# Patient Record
Sex: Male | Born: 1960 | Race: White | Hispanic: No | Marital: Married | Smoking: Never smoker
Health system: Southern US, Community
[De-identification: ages and names within clinical notes are randomized; demographics above are authoritative.]

## PROBLEM LIST (undated history)

## (undated) DIAGNOSIS — I219 Acute myocardial infarction, unspecified: Secondary | ICD-10-CM

## (undated) HISTORY — PX: CARDIAC SURGERY: SHX584

## (undated) HISTORY — PX: CORONARY ANGIOPLASTY WITH STENT PLACEMENT: SHX49

---

## 2018-08-20 ENCOUNTER — Other Ambulatory Visit: Payer: Self-pay

## 2018-08-20 ENCOUNTER — Emergency Department (HOSPITAL_BASED_OUTPATIENT_CLINIC_OR_DEPARTMENT_OTHER): Payer: No Typology Code available for payment source

## 2018-08-20 ENCOUNTER — Encounter (HOSPITAL_BASED_OUTPATIENT_CLINIC_OR_DEPARTMENT_OTHER): Payer: Self-pay | Admitting: Emergency Medicine

## 2018-08-20 ENCOUNTER — Emergency Department (HOSPITAL_BASED_OUTPATIENT_CLINIC_OR_DEPARTMENT_OTHER)
Admission: EM | Admit: 2018-08-20 | Discharge: 2018-08-20 | Disposition: A | Discharge status: Home / Self Care | Payer: No Typology Code available for payment source | Attending: Emergency Medicine | Admitting: Emergency Medicine

## 2018-08-20 DIAGNOSIS — M25522 Pain in left elbow: Secondary | ICD-10-CM | POA: Diagnosis present

## 2018-08-20 DIAGNOSIS — M7022 Olecranon bursitis, left elbow: Secondary | ICD-10-CM | POA: Diagnosis not present

## 2018-08-20 DIAGNOSIS — Y939 Activity, unspecified: Secondary | ICD-10-CM | POA: Diagnosis not present

## 2018-08-20 HISTORY — DX: Acute myocardial infarction, unspecified: I21.9

## 2018-08-20 MED ORDER — IBUPROFEN 800 MG PO TABS
800.0000 mg | ORAL_TABLET | Freq: Once | ORAL | Status: AC
  Administered 2018-08-20: 18:00:00 800 mg via ORAL
  Filled 2018-08-20: qty 1

## 2018-08-20 NOTE — Discharge Instructions (Addendum)
Recommend 600 mg to 800 mg of Motrin 3 times a day for the next 5 to 7 days.  Recommend ice.  Follow-up with orthopedics as discussed.

## 2018-08-20 NOTE — ED Provider Notes (Signed)
[facility] EMERGENCY DEPARTMENT Provider Note   CSN: [id number] Arrival date & time: 08/20/18  1728    History   Chief Complaint Chief Complaint  Patient presents with  . Joint Swelling    HPI Manuel HolmesCharles Harrington is a 58 y.o. male.     The history is provided by the patient.  Arm Injury Location:  Elbow Elbow location:  L elbow Upper extremity injury: possibly.   Pain details:    Quality: no pain.   Severity:  No pain   Onset quality:  Gradual   Duration:  3 days   Timing:  Constant   Progression:  Worsening Relieved by:  Nothing Worsened by:  Movement Associated symptoms: swelling   Associated symptoms: no back pain, no decreased range of motion, no fatigue, no fever, no muscle weakness and no neck pain     Past Medical History:  Diagnosis Date  . Heart attack (HCC)     There are no active problems to display for this patient.        Home Medications    Prior to Admission medications   Not on File    Family History No family history on file.  Social History Social History   Tobacco Use  . Smoking status: Not on file  Substance Use Topics  . Alcohol use: Never    Frequency: Never  . Drug use: Never     Allergies   Patient has no allergy information on record.   Review of Systems Review of Systems  Constitutional: Negative for chills, fatigue and fever.  HENT: Negative for ear pain and sore throat.   Eyes: Negative for pain and visual disturbance.  Respiratory: Negative for cough and shortness of breath.   Cardiovascular: Negative for chest pain and palpitations.  Gastrointestinal: Negative for abdominal pain and vomiting.  Genitourinary: Negative for dysuria and hematuria.  Musculoskeletal: Positive for joint swelling. Negative for arthralgias, back pain and neck pain.  Skin: Negative for color change and rash.  Neurological: Negative for seizures and syncope.  All other systems reviewed and are negative.    Physical  Exam Updated Vital Signs BP 121/87 (BP Location: Right Arm)   Pulse 100   Temp 97.9 F (36.6 C) (Oral)   Resp 18   Ht 5\' 5"  (1.651 m)   Wt 79.8 kg   SpO2 100%   BMI 29.29 kg/m   Physical Exam Vitals signs and nursing note reviewed.  Constitutional:      Appearance: He is well-developed.  HENT:     Head: Normocephalic and atraumatic.     Nose: Nose normal.     Mouth/Throat:     Mouth: Mucous membranes are moist.  Eyes:     Conjunctiva/sclera: Conjunctivae normal.  Neck:     Musculoskeletal: Normal range of motion and neck supple.  Cardiovascular:     Rate and Rhythm: Normal rate and regular rhythm.     Heart sounds: No murmur.  Pulmonary:     Effort: Pulmonary effort is normal. No respiratory distress.     Breath sounds: Normal breath sounds.  Abdominal:     Palpations: Abdomen is soft.     Tenderness: There is no abdominal tenderness.  Musculoskeletal: Normal range of motion.        General: Swelling present. No tenderness.     Comments: Swelling to left olecranon bursa  Skin:    General: Skin is warm and dry.     Findings: No erythema.  Comments: No redness or swelling of the left elbow  Neurological:     General: No focal deficit present.     Mental Status: He is alert.     Sensory: No sensory deficit.     Motor: No weakness.      ED Treatments / Results  Labs (all labs ordered are listed, but only abnormal results are displayed) Labs Reviewed - No data to display  EKG None  Radiology No results found.  Procedures Procedures (including critical care time)  Medications Ordered in ED Medications  ibuprofen (ADVIL) tablet 800 mg (has no administration in time range)     Initial Impression / Assessment and Plan / ED Course  I have reviewed the triage vital signs and the nursing notes.  Pertinent labs & imaging results that were available during my care of the patient were reviewed by me and considered in my medical decision making (see chart  for details).     Manuel Harrington is a 58 year old male with no significant medical history who presents to the ED with left elbow swelling.  Patient with normal vitals.  No fever.  Swelling ongoing for the last 3 days.  Unsure if any trauma.  Possibly some on Friday.  Patient on exam has olecranon bursitis.  There is no signs of infection.  Normal range of motion of the left arm.  Recommend conservative treatment with ice, Motrin.  He will follow-up with his orthopedic tomorrow.  Given return precautions and discharged from ED in good condition.  No concern for fracture.  This chart was dictated using voice recognition software.  Despite best efforts to proofread,  errors can occur which can change the documentation meaning.    Final Clinical Impressions(s) / ED Diagnoses   Final diagnoses:  Olecranon bursitis of left elbow    ED Discharge Orders    None       Lennice Sites, DO 08/20/18 1750

## 2018-08-20 NOTE — ED Triage Notes (Signed)
Pt c/o left elbow swelling. Pt states felt "pop" on Friday when getting out of bed. Pt denies injury/

## 2018-08-22 ENCOUNTER — Emergency Department (HOSPITAL_BASED_OUTPATIENT_CLINIC_OR_DEPARTMENT_OTHER): Payer: No Typology Code available for payment source

## 2018-08-22 ENCOUNTER — Emergency Department (HOSPITAL_BASED_OUTPATIENT_CLINIC_OR_DEPARTMENT_OTHER)
Admission: EM | Admit: 2018-08-22 | Discharge: 2018-08-23 | Disposition: A | Discharge status: Home / Self Care | Payer: No Typology Code available for payment source | Attending: Emergency Medicine | Admitting: Emergency Medicine

## 2018-08-22 ENCOUNTER — Other Ambulatory Visit: Payer: Self-pay

## 2018-08-22 ENCOUNTER — Encounter (HOSPITAL_BASED_OUTPATIENT_CLINIC_OR_DEPARTMENT_OTHER): Payer: Self-pay

## 2018-08-22 DIAGNOSIS — R58 Hemorrhage, not elsewhere classified: Secondary | ICD-10-CM

## 2018-08-22 DIAGNOSIS — R233 Spontaneous ecchymoses: Secondary | ICD-10-CM | POA: Insufficient documentation

## 2018-08-22 DIAGNOSIS — I252 Old myocardial infarction: Secondary | ICD-10-CM | POA: Insufficient documentation

## 2018-08-22 DIAGNOSIS — M25522 Pain in left elbow: Secondary | ICD-10-CM | POA: Insufficient documentation

## 2018-08-22 NOTE — ED Triage Notes (Signed)
Pt state he felt a pop in his left elbow 7/31-swelling to area 8/1 and was seen here-bruising x today-NAD-steady gait

## 2018-08-22 NOTE — ED Provider Notes (Signed)
[facility] EMERGENCY DEPARTMENT Provider Note   CSN: [id number] Arrival date & time: 08/22/18  2204     History   Chief Complaint Chief Complaint  Patient presents with  . Elbow Injury    HPI Manuel Harrington is a 58 y.o. male.     The history is provided by the patient.  Arm Injury Location:  Elbow Elbow location:  L elbow Upper extremity injury: woke up and heard a pop about a week ago,  bruise on the L olecranon and medial condyle.   Pain details:    Quality:  Aching   Radiates to:  Does not radiate   Severity:  Mild   Onset quality:  Sudden   Duration:  1 week   Timing:  Constant   Progression:  Unchanged Handedness:  Right-handed Dislocation: no   Foreign body present:  No foreign bodies Prior injury to area:  No Relieved by:  Nothing Worsened by:  Nothing Ineffective treatments:  None tried Associated symptoms: no back pain, no decreased range of motion, no fatigue, no fever, no muscle weakness, no neck pain, no numbness, no stiffness, no swelling and no tingling   Risk factors: no concern for non-accidental trauma   Seen for same post pop with bruise on olecranon.  Now today woke with bruise on medial epicondyle.  No new trauma.  No weakness no numbness.  No f/c/r.  No weight loss. Is on ASA  Past Medical History:  Diagnosis Date  . Heart attack (HCC)     There are no active problems to display for this patient.   Past Surgical History:  Procedure Laterality Date  . CARDIAC SURGERY    . CORONARY ANGIOPLASTY WITH STENT PLACEMENT          Home Medications    Prior to Admission medications   Medication Sig Start Date End Date Taking? Authorizing Provider  ibuprofen (ADVIL) 600 MG tablet Take 600 mg by mouth every 6 (six) hours as needed for moderate pain.   Yes [provider]    Family History No family history on file.  Social History Social History   Tobacco Use  . Smoking status: Never Smoker  . Smokeless tobacco:  Never Used  Substance Use Topics  . Alcohol use: Never    Frequency: Never  . Drug use: Never     Allergies   Patient has no known allergies.   Review of Systems Review of Systems  Constitutional: Negative for fatigue, fever and unexpected weight change.  HENT: Negative for sore throat.   Eyes: Negative for visual disturbance.  Respiratory: Negative for cough and shortness of breath.   Cardiovascular: Negative for chest pain.  Gastrointestinal: Negative for abdominal pain.  Musculoskeletal: Negative for back pain, neck pain and stiffness.  Neurological: Negative for weakness and numbness.  Hematological: Negative for adenopathy.  Psychiatric/Behavioral: Negative for agitation.  All other systems reviewed and are negative.    Physical Exam Updated Vital Signs BP (!) 149/93 (BP Location: Right Arm)   Pulse 98   Temp 98.4 F (36.9 C) (Oral)   Resp 20   Ht 5\' 5"  (1.651 m)   Wt 87.1 kg   SpO2 99%   BMI 31.95 kg/m   Physical Exam Vitals signs and nursing note reviewed.  Constitutional:      General: He is not in acute distress.    Appearance: He is normal weight.  HENT:     Head: Normocephalic and atraumatic.     Nose:  Nose normal.     Mouth/Throat:     Mouth: Mucous membranes are moist.     Pharynx: Oropharynx is clear.  Eyes:     Conjunctiva/sclera: Conjunctivae normal.     Pupils: Pupils are equal, round, and reactive to light.  Neck:     Musculoskeletal: Normal range of motion and neck supple.  Cardiovascular:     Rate and Rhythm: Normal rate and regular rhythm.     Pulses: Normal pulses.     Heart sounds: Normal heart sounds.  Pulmonary:     Effort: Pulmonary effort is normal.     Breath sounds: Normal breath sounds.  Abdominal:     General: Abdomen is flat. Bowel sounds are normal.     Tenderness: There is no abdominal tenderness.  Musculoskeletal: Normal range of motion.     Left shoulder: Normal.     Left elbow: Normal. He exhibits normal range  of motion, no swelling, no effusion, no deformity and no laceration. No tenderness found.     Left wrist: Normal.     Left upper arm: Normal. He exhibits no tenderness, no bony tenderness, no swelling, no edema, no deformity and no laceration.     Left forearm: Normal.       Arms:     Left hand: Normal. He exhibits normal capillary refill. Normal sensation noted. Normal strength noted.     Comments: B heads of the biceps intact.  FROM of the LUE.  Intact pronation and supination.  Intact flexion and extension.  No effusion.    Lymphadenopathy:     Head:     Right side of head: No submental, submandibular, preauricular, posterior auricular or occipital adenopathy.     Left side of head: No submental, submandibular, preauricular, posterior auricular or occipital adenopathy.     Cervical: No cervical adenopathy.     Upper Body:     Right upper body: No supraclavicular, axillary, pectoral or epitrochlear adenopathy.     Left upper body: No supraclavicular, axillary, pectoral or epitrochlear adenopathy.  Skin:    General: Skin is warm and dry.     Capillary Refill: Capillary refill takes less than 2 seconds.  Neurological:     General: No focal deficit present.     Mental Status: He is alert and oriented to person, place, and time.     Deep Tendon Reflexes: Reflexes normal.  Psychiatric:        Mood and Affect: Mood normal.        Behavior: Behavior normal.      ED Treatments / Results  Labs (all labs ordered are listed, but only abnormal results are displayed) Labs Reviewed - No data to display  EKG None  Radiology Dg Elbow Complete Left  Result Date: 08/22/2018 CLINICAL DATA:  Elbow pain large hematoma EXAM: LEFT ELBOW - COMPLETE 3+ VIEW COMPARISON:  None. FINDINGS: No fracture or malalignment. No significant elbow effusion. Soft tissue swelling is evident IMPRESSION: No acute osseous abnormality Electronically Signed   By: Donavan Foil M.D.   On: 08/22/2018 23:28     Procedures Procedures (including critical care time)  Medications Ordered in ED Medications - No data to display   RICE therapy.  No fractures all tendons intact,  Only ecchymosis with no LAN anywhere.  I suspect the patient hit it on something but does not remember.    Final Clinical Impressions(s) / ED Diagnoses   Return for intractable cough, coughing up blood,fevers >100.4 unrelieved by medication, shortness  of breath, intractable vomiting, chest pain, shortness of breath, weakness,numbness, changes in speech, facial asymmetry,abdominal pain, passing out,Inability to tolerate liquids or food, cough, altered mental status or any concerns. No signs of systemic illness or infection. The patient is nontoxic-appearing on exam and vital signs are within normal limits.   I have reviewed the triage vital signs and the nursing notes. Pertinent labs &imaging results that were available during my care of the patient were reviewed by me and considered in my medical decision making (see chart for details).  After history, exam, and medical workup I feel the patient has been appropriately medically screened and is safe for discharge home. Pertinent diagnoses were discussed with the patient. Patient was given return precautions    Barnes Florek, MD 08/23/18 [id number]

## 2018-08-23 ENCOUNTER — Encounter (HOSPITAL_BASED_OUTPATIENT_CLINIC_OR_DEPARTMENT_OTHER): Payer: Self-pay | Admitting: Emergency Medicine

## 2020-08-27 IMAGING — DX LEFT ELBOW - COMPLETE 3+ VIEW
4 series · 4 of 4 positions shown · non-contrast
Comparison: None.

CLINICAL DATA: Elbow pain large hematoma

EXAM:
LEFT ELBOW - COMPLETE 3+ VIEW

[elbow ap]
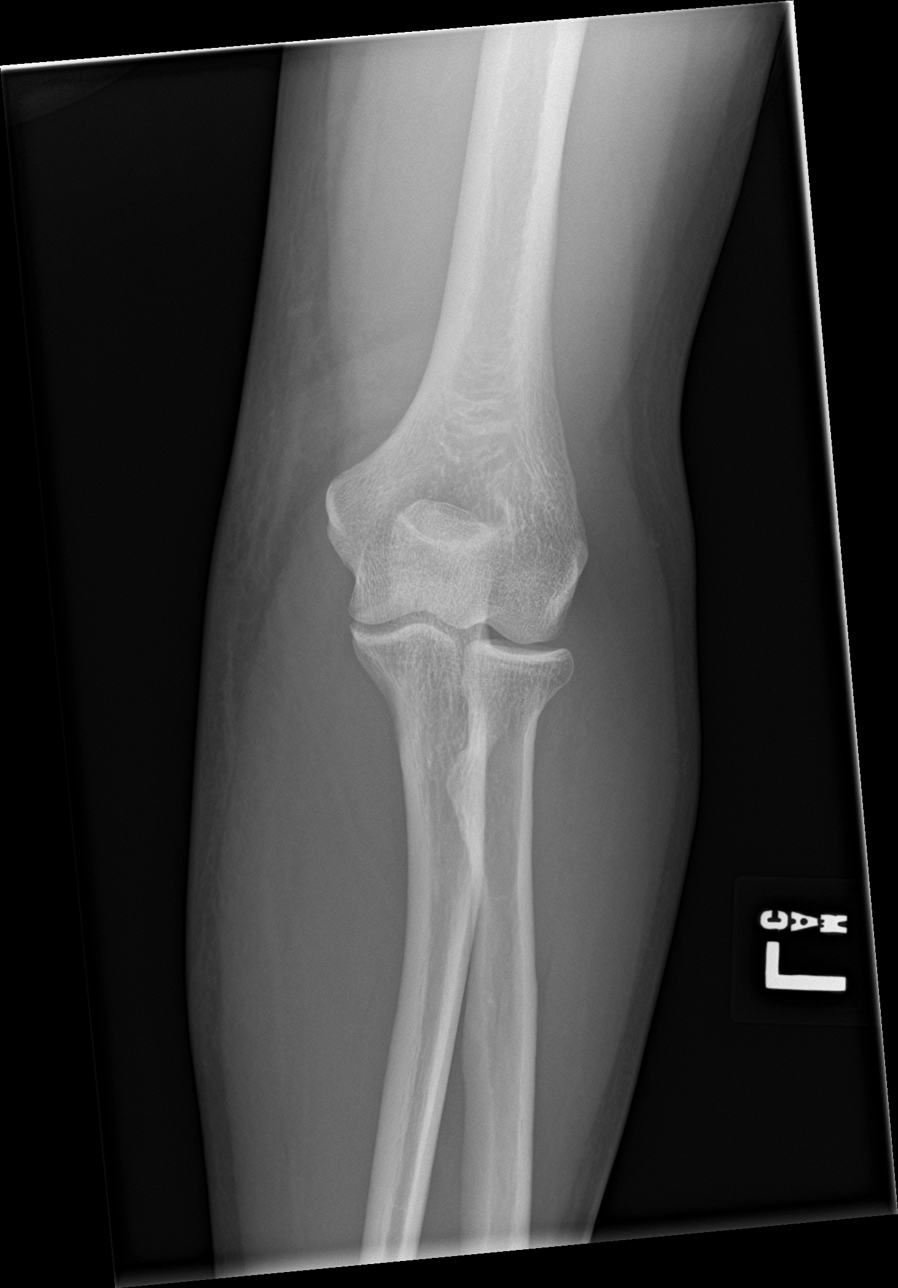

[elbow obl (1 of 2)]
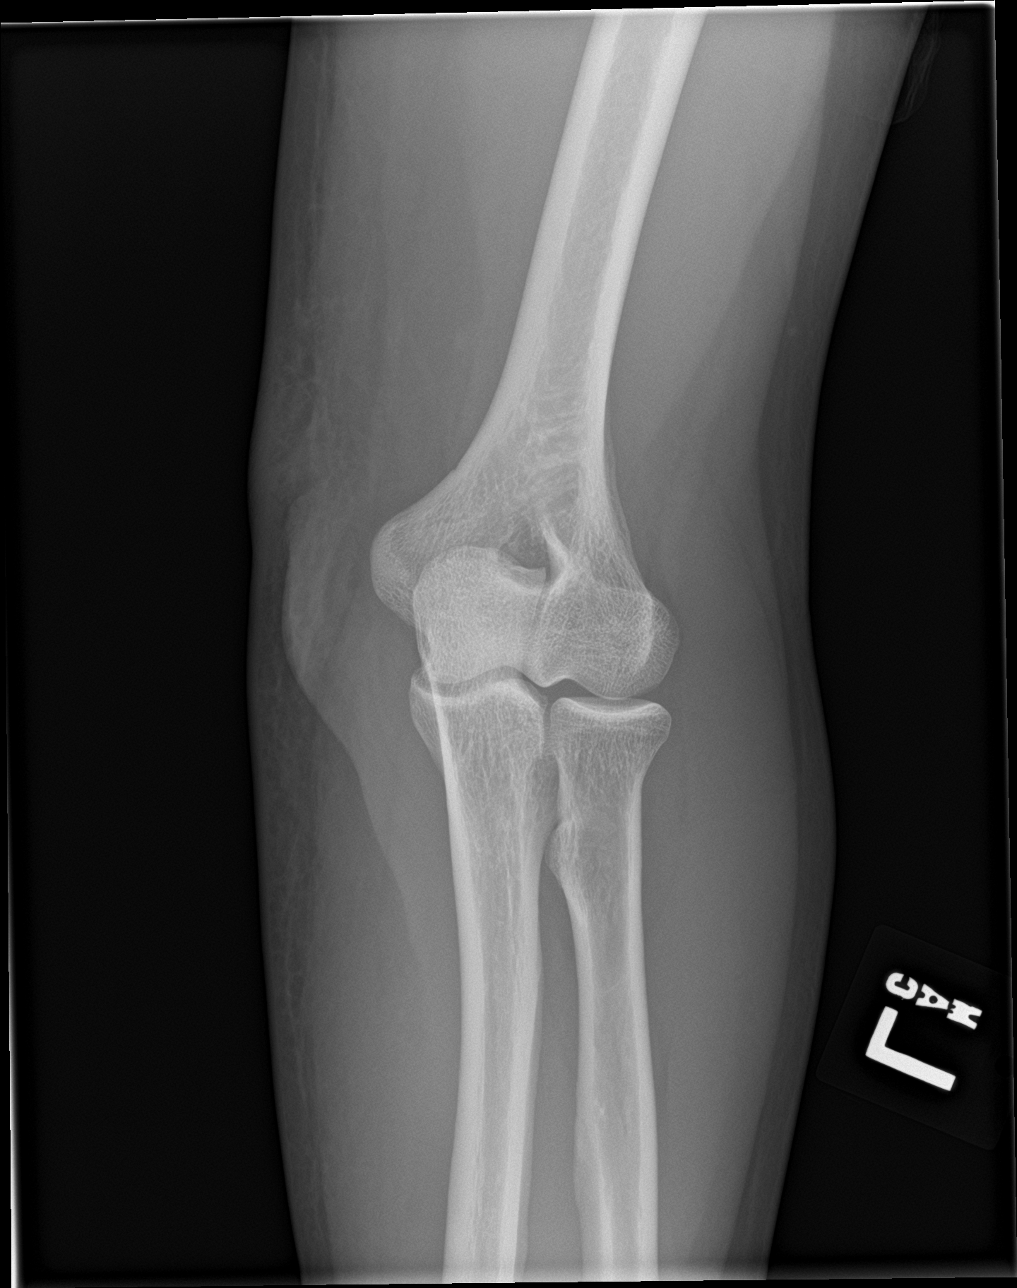

[elbow obl (2 of 2)]
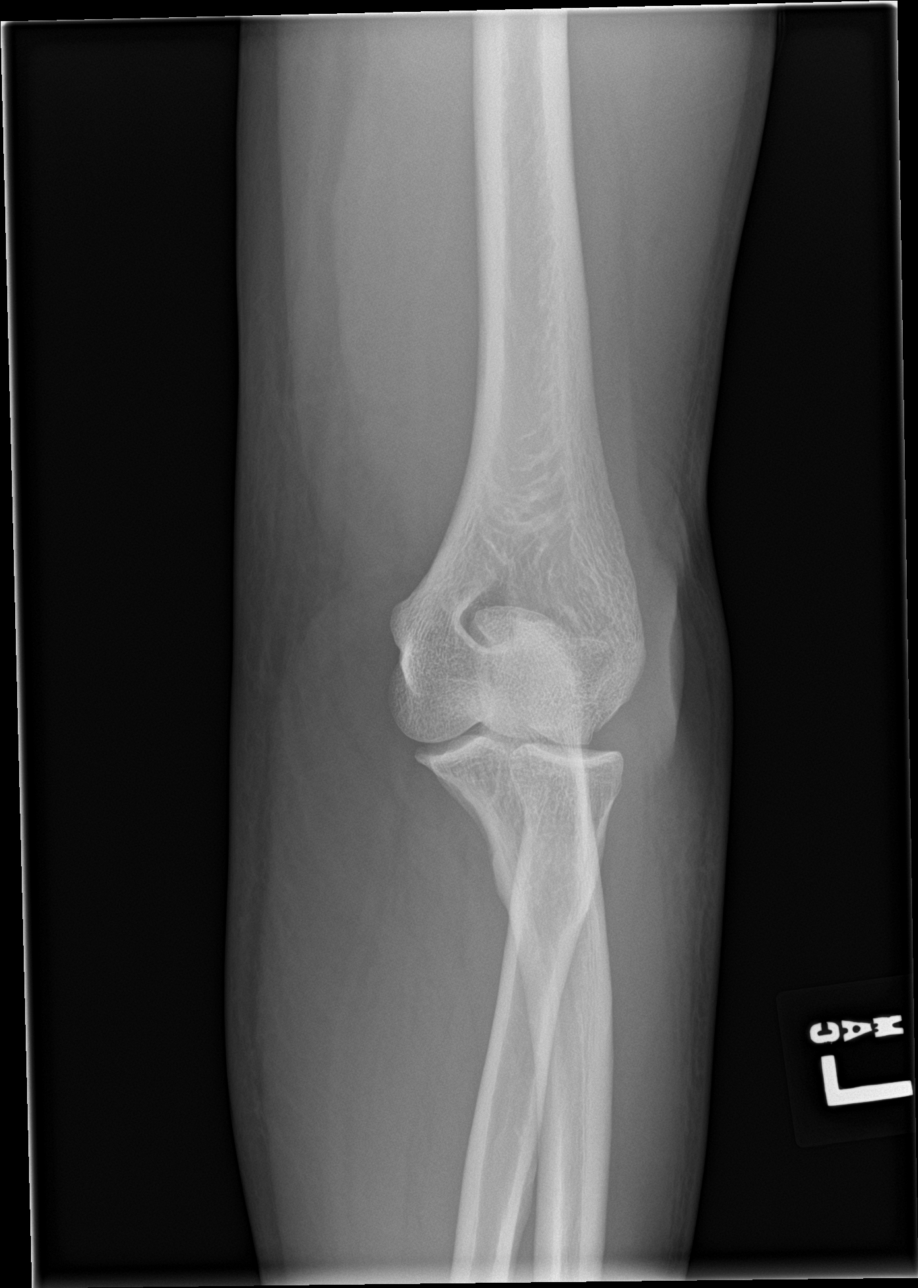

[elbow lat]
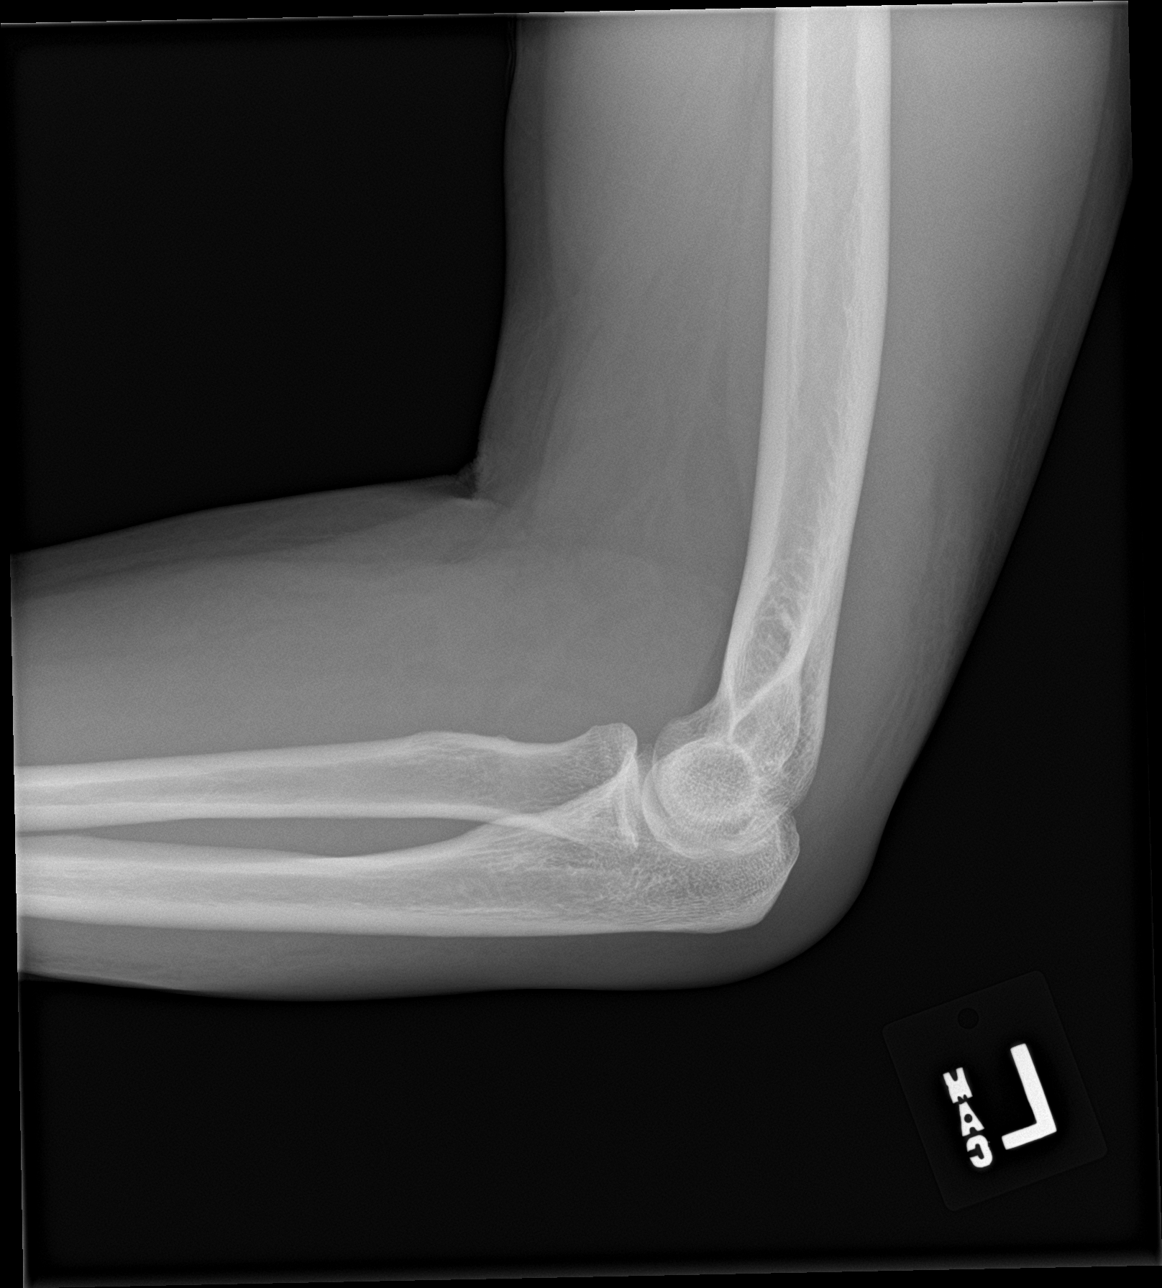

[4 of 4 positions shown; findings below may reference images not displayed]

FINDINGS: No fracture or malalignment. No significant elbow effusion. Soft
tissue swelling is evident
IMPRESSION: No acute osseous abnormality
